# Patient Record
Sex: Male | Born: 1965 | Race: White | Hispanic: No | Marital: Married | Smoking: Never smoker
Health system: Southern US, Community
[De-identification: ages and names within clinical notes are randomized; demographics above are authoritative.]

## PROBLEM LIST (undated history)

## (undated) DIAGNOSIS — F32A Depression, unspecified: Secondary | ICD-10-CM

## (undated) DIAGNOSIS — F329 Major depressive disorder, single episode, unspecified: Secondary | ICD-10-CM

## (undated) DIAGNOSIS — R112 Nausea with vomiting, unspecified: Secondary | ICD-10-CM

## (undated) DIAGNOSIS — Z9889 Other specified postprocedural states: Secondary | ICD-10-CM

## (undated) DIAGNOSIS — F988 Other specified behavioral and emotional disorders with onset usually occurring in childhood and adolescence: Secondary | ICD-10-CM

## (undated) HISTORY — DX: Major depressive disorder, single episode, unspecified: F32.9

## (undated) HISTORY — PX: RHINOPLASTY: SUR1284

## (undated) HISTORY — DX: Other specified behavioral and emotional disorders with onset usually occurring in childhood and adolescence: F98.8

## (undated) HISTORY — DX: Depression, unspecified: F32.A

---

## 2009-09-04 ENCOUNTER — Encounter: Admission: RE | Admit: 2009-09-04 | Discharge: 2009-09-04 | Payer: Self-pay | Admitting: Sports Medicine

## 2009-09-19 ENCOUNTER — Encounter: Admission: RE | Admit: 2009-09-19 | Discharge: 2009-09-19 | Payer: Self-pay | Admitting: Sports Medicine

## 2010-07-15 ENCOUNTER — Encounter: Payer: Self-pay | Admitting: Sports Medicine

## 2012-05-11 ENCOUNTER — Other Ambulatory Visit: Payer: Self-pay | Admitting: Family Medicine

## 2012-05-11 ENCOUNTER — Ambulatory Visit
Admission: RE | Admit: 2012-05-11 | Discharge: 2012-05-11 | Disposition: A | Discharge status: Home / Self Care | Payer: BC Managed Care – PPO | Source: Ambulatory Visit | Attending: Family Medicine | Admitting: Family Medicine

## 2012-05-11 DIAGNOSIS — T148XXA Other injury of unspecified body region, initial encounter: Secondary | ICD-10-CM

## 2012-05-11 DIAGNOSIS — R52 Pain, unspecified: Secondary | ICD-10-CM

## 2014-12-23 ENCOUNTER — Encounter (HOSPITAL_BASED_OUTPATIENT_CLINIC_OR_DEPARTMENT_OTHER)
Admission: RE | Admit: 2014-12-23 | Discharge: 2014-12-23 | Disposition: A | Discharge status: Home / Self Care | Payer: BLUE CROSS/BLUE SHIELD | Source: Ambulatory Visit | Attending: Orthopedic Surgery | Admitting: Orthopedic Surgery

## 2014-12-23 ENCOUNTER — Encounter (HOSPITAL_BASED_OUTPATIENT_CLINIC_OR_DEPARTMENT_OTHER): Payer: Self-pay | Admitting: *Deleted

## 2014-12-23 DIAGNOSIS — M1712 Unilateral primary osteoarthritis, left knee: Secondary | ICD-10-CM | POA: Diagnosis not present

## 2014-12-23 DIAGNOSIS — M1711 Unilateral primary osteoarthritis, right knee: Secondary | ICD-10-CM | POA: Diagnosis present

## 2014-12-23 LAB — SURGICAL PCR SCREEN
MRSA, PCR: NEGATIVE
Staphylococcus aureus: NEGATIVE

## 2014-12-23 NOTE — Progress Notes (Signed)
Pt in and PAT Nasal MRSA swab sent.  No further PAT needed.  Will notify pt if swab positive and instructions given.

## 2014-12-28 ENCOUNTER — Other Ambulatory Visit: Payer: Self-pay | Admitting: Physician Assistant

## 2015-01-03 ENCOUNTER — Other Ambulatory Visit: Payer: Self-pay | Admitting: Physician Assistant

## 2015-01-03 NOTE — H&P (Signed)
This is a 49 year-old gentleman who presents to our clinic today with continued right knee pain.  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  This has been going on for over a year now.  He has tried Corticosteroid and Visco supplementation injections, all with decreasing effectiveness.  At this point in time he is ready to proceed with a medial compartment knee replacement on the right.   Past medical, social and family history reviewed in detail on the patient questionnaire and signed.  Review of systems: As detailed in HPI.  All others reviewed and are negative.   EXAMINATION: Well-developed, well-nourished male in no acute distress.  Alert and oriented x 3.  Examination of his right knee reveals range of motion 0-125 degrees.  Minimal patellofemoral crepitus.  No joint line tenderness.  Minimal medial joint line tenderness.  Negative log roll.  Negative straight leg raise.    X-RAYS: Four views of the right knee with sizing balls, to include a stress view, reveal moderate degenerative changes in the medial compartment.  Minimal changes on the left in the lateral and patellofemoral compartments.  Really no closing of the lateral compartment on stress view.    IMPRESSION: End stage degenerative joint disease of the right knee medial compartment.    PLAN: At this point in time there is nothing short of a medial compartment arthroplasty of the right knee that is going to relieve Ruben Mcdonald's pain.  We are proceeding with this in the near future.  Risks, benefits and possible complications of surgery have been reviewed.  Rehab and recovery time discussed.  All questions answered.  Paperwork complete. We will see Ruben Mcdonald at the time of operative intervention.     Loreta Aveaniel F. Murphy, M.D

## 2015-01-05 ENCOUNTER — Ambulatory Visit (HOSPITAL_BASED_OUTPATIENT_CLINIC_OR_DEPARTMENT_OTHER): Payer: BLUE CROSS/BLUE SHIELD | Admitting: Anesthesiology

## 2015-01-05 ENCOUNTER — Encounter (HOSPITAL_BASED_OUTPATIENT_CLINIC_OR_DEPARTMENT_OTHER): Payer: Self-pay | Admitting: *Deleted

## 2015-01-05 ENCOUNTER — Ambulatory Visit (HOSPITAL_BASED_OUTPATIENT_CLINIC_OR_DEPARTMENT_OTHER)
Admission: RE | Admit: 2015-01-05 | Discharge: 2015-01-06 | Disposition: A | Discharge status: Home / Self Care | Payer: BLUE CROSS/BLUE SHIELD | Source: Ambulatory Visit | Attending: Orthopedic Surgery | Admitting: Orthopedic Surgery

## 2015-01-05 ENCOUNTER — Encounter (HOSPITAL_BASED_OUTPATIENT_CLINIC_OR_DEPARTMENT_OTHER)
Admission: RE | Disposition: A | Discharge status: Home / Self Care | Payer: Self-pay | Source: Ambulatory Visit | Attending: Orthopedic Surgery

## 2015-01-05 DIAGNOSIS — M1712 Unilateral primary osteoarthritis, left knee: Secondary | ICD-10-CM | POA: Diagnosis not present

## 2015-01-05 DIAGNOSIS — Z96651 Presence of right artificial knee joint: Secondary | ICD-10-CM

## 2015-01-05 HISTORY — PX: PARTIAL KNEE ARTHROPLASTY: SHX2174

## 2015-01-05 HISTORY — DX: Nausea with vomiting, unspecified: R11.2

## 2015-01-05 HISTORY — DX: Other specified postprocedural states: Z98.890

## 2015-01-05 LAB — POCT HEMOGLOBIN-HEMACUE: Hemoglobin: 15.1 g/dL (ref 13.0–17.0)

## 2015-01-05 SURGERY — ARTHROPLASTY, KNEE, UNICOMPARTMENTAL
Anesthesia: General | Site: Knee | Laterality: Right

## 2015-01-05 MED ORDER — DOCUSATE SODIUM 100 MG PO CAPS
100.0000 mg | ORAL_CAPSULE | Freq: Two times a day (BID) | ORAL | Status: DC
  Administered 2015-01-05: 100 mg via ORAL
  Filled 2015-01-05: qty 1

## 2015-01-05 MED ORDER — HYDROMORPHONE HCL 1 MG/ML IJ SOLN
INTRAMUSCULAR | Status: AC
  Filled 2015-01-05: qty 1

## 2015-01-05 MED ORDER — METHOCARBAMOL 500 MG PO TABS
500.0000 mg | ORAL_TABLET | Freq: Four times a day (QID) | ORAL | Status: DC | PRN
Start: 2015-01-05 — End: 2015-01-06
  Administered 2015-01-05 – 2015-01-06 (×2): 500 mg via ORAL
  Filled 2015-01-05 (×2): qty 1

## 2015-01-05 MED ORDER — DEXAMETHASONE SODIUM PHOSPHATE 4 MG/ML IJ SOLN
INTRAMUSCULAR | Status: DC | PRN
  Administered 2015-01-05: 10 mg via INTRAVENOUS

## 2015-01-05 MED ORDER — MIDAZOLAM HCL 2 MG/2ML IJ SOLN
1.0000 mg | INTRAMUSCULAR | Status: DC | PRN
  Administered 2015-01-05: 2 mg via INTRAVENOUS

## 2015-01-05 MED ORDER — POLYETHYLENE GLYCOL 3350 17 G PO PACK: 17.0000 g | PACK | Freq: Every day | ORAL | Status: DC | PRN

## 2015-01-05 MED ORDER — SCOPOLAMINE 1 MG/3DAYS TD PT72: 1.0000 | MEDICATED_PATCH | Freq: Once | TRANSDERMAL | Status: DC | PRN

## 2015-01-05 MED ORDER — OXYCODONE-ACETAMINOPHEN 5-325 MG PO TABS: 1.0000 | ORAL_TABLET | ORAL | Status: DC | PRN

## 2015-01-05 MED ORDER — BISACODYL 5 MG PO TBEC: 5.0000 mg | DELAYED_RELEASE_TABLET | Freq: Every day | ORAL | Status: DC | PRN

## 2015-01-05 MED ORDER — PROPOFOL 10 MG/ML IV BOLUS
INTRAVENOUS | Status: DC | PRN
  Administered 2015-01-05: 200 mg via INTRAVENOUS

## 2015-01-05 MED ORDER — METOCLOPRAMIDE HCL 5 MG PO TABS
5.0000 mg | ORAL_TABLET | Freq: Three times a day (TID) | ORAL | Status: DC | PRN
Start: 2015-01-05 — End: 2015-01-06

## 2015-01-05 MED ORDER — METOCLOPRAMIDE HCL 5 MG/ML IJ SOLN: 5.0000 mg | Freq: Three times a day (TID) | INTRAMUSCULAR | Status: DC | PRN

## 2015-01-05 MED ORDER — ASPIRIN EC 325 MG PO TBEC: 325.0000 mg | DELAYED_RELEASE_TABLET | Freq: Every day | ORAL | Status: DC

## 2015-01-05 MED ORDER — ONDANSETRON HCL 4 MG PO TABS: 4.0000 mg | ORAL_TABLET | Freq: Four times a day (QID) | ORAL | Status: DC | PRN

## 2015-01-05 MED ORDER — ONDANSETRON HCL 4 MG/2ML IJ SOLN: 4.0000 mg | Freq: Four times a day (QID) | INTRAMUSCULAR | Status: DC | PRN

## 2015-01-05 MED ORDER — SODIUM CHLORIDE 0.9 % IR SOLN
Status: DC | PRN
  Administered 2015-01-05: 3000 mL

## 2015-01-05 MED ORDER — LACTATED RINGERS IV SOLN
INTRAVENOUS | Status: DC
  Administered 2015-01-05: 10 mL/h via INTRAVENOUS
  Administered 2015-01-05: 07:00:00 via INTRAVENOUS

## 2015-01-05 MED ORDER — METHOCARBAMOL 500 MG PO TABS: 500.0000 mg | ORAL_TABLET | Freq: Four times a day (QID) | ORAL | Status: DC

## 2015-01-05 MED ORDER — BUPIVACAINE LIPOSOME 1.3 % IJ SUSP
INTRAMUSCULAR | Status: DC | PRN
  Administered 2015-01-05: 20 mL

## 2015-01-05 MED ORDER — GLYCOPYRROLATE 0.2 MG/ML IJ SOLN: 0.2000 mg | Freq: Once | INTRAMUSCULAR | Status: DC | PRN

## 2015-01-05 MED ORDER — CEFAZOLIN SODIUM-DEXTROSE 2-3 GM-% IV SOLR
INTRAVENOUS | Status: AC
  Filled 2015-01-05: qty 50

## 2015-01-05 MED ORDER — FENTANYL CITRATE (PF) 100 MCG/2ML IJ SOLN
INTRAMUSCULAR | Status: AC
  Filled 2015-01-05: qty 2

## 2015-01-05 MED ORDER — MORPHINE SULFATE 10 MG/ML IJ SOLN
INTRAMUSCULAR | Status: DC | PRN
  Administered 2015-01-05 (×3): 2 mg via INTRAVENOUS

## 2015-01-05 MED ORDER — VENLAFAXINE HCL ER 150 MG PO CP24
150.0000 mg | ORAL_CAPSULE | Freq: Every day | ORAL | Status: DC
Start: 2015-01-06 — End: 2015-01-06

## 2015-01-05 MED ORDER — ONDANSETRON HCL 4 MG/2ML IJ SOLN
4.0000 mg | Freq: Once | INTRAMUSCULAR | Status: AC | PRN
  Administered 2015-01-05: 4 mg via INTRAVENOUS
  Filled 2015-01-05: qty 2

## 2015-01-05 MED ORDER — BUPIVACAINE-EPINEPHRINE (PF) 0.5% -1:200000 IJ SOLN
INTRAMUSCULAR | Status: DC | PRN
  Administered 2015-01-05: 30 mL via PERINEURAL

## 2015-01-05 MED ORDER — LACTATED RINGERS IV SOLN: INTRAVENOUS | Status: DC

## 2015-01-05 MED ORDER — SODIUM CHLORIDE 0.9 % IR SOLN
Status: DC | PRN
  Administered 2015-01-05: 40 mL

## 2015-01-05 MED ORDER — HYDROMORPHONE HCL 1 MG/ML IJ SOLN
0.2500 mg | INTRAMUSCULAR | Status: DC | PRN
  Administered 2015-01-05 (×2): 0.5 mg via INTRAVENOUS

## 2015-01-05 MED ORDER — ONDANSETRON HCL 4 MG PO TABS: 4.0000 mg | ORAL_TABLET | Freq: Three times a day (TID) | ORAL | Status: DC | PRN

## 2015-01-05 MED ORDER — ONDANSETRON HCL 4 MG/2ML IJ SOLN
INTRAMUSCULAR | Status: DC | PRN
  Administered 2015-01-05: 4 mg via INTRAVENOUS

## 2015-01-05 MED ORDER — LIDOCAINE HCL (CARDIAC) 20 MG/ML IV SOLN
INTRAVENOUS | Status: DC | PRN
  Administered 2015-01-05: 50 mg via INTRAVENOUS

## 2015-01-05 MED ORDER — HYDROMORPHONE HCL 1 MG/ML IJ SOLN: 0.5000 mg | INTRAMUSCULAR | Status: DC | PRN

## 2015-01-05 MED ORDER — MORPHINE SULFATE 10 MG/ML IJ SOLN
INTRAMUSCULAR | Status: AC
  Filled 2015-01-05: qty 1

## 2015-01-05 MED ORDER — BUPIVACAINE LIPOSOME 1.3 % IJ SUSP
INTRAMUSCULAR | Status: AC
  Filled 2015-01-05: qty 20

## 2015-01-05 MED ORDER — POTASSIUM CHLORIDE IN NACL 20-0.9 MEQ/L-% IV SOLN
INTRAVENOUS | Status: DC
  Administered 2015-01-05: 13:00:00 via INTRAVENOUS
  Filled 2015-01-05: qty 1000

## 2015-01-05 MED ORDER — CEFAZOLIN SODIUM-DEXTROSE 2-3 GM-% IV SOLR
2.0000 g | INTRAVENOUS | Status: AC
  Administered 2015-01-05: 2 g via INTRAVENOUS

## 2015-01-05 MED ORDER — OXYCODONE-ACETAMINOPHEN 5-325 MG PO TABS
1.0000 | ORAL_TABLET | ORAL | Status: DC | PRN
  Administered 2015-01-05: 1 via ORAL
  Administered 2015-01-06 (×3): 2 via ORAL
  Filled 2015-01-05 (×3): qty 2
  Filled 2015-01-05: qty 1

## 2015-01-05 MED ORDER — MEPERIDINE HCL 25 MG/ML IJ SOLN: 6.2500 mg | INTRAMUSCULAR | Status: DC | PRN

## 2015-01-05 MED ORDER — FENTANYL CITRATE (PF) 100 MCG/2ML IJ SOLN
50.0000 ug | INTRAMUSCULAR | Status: DC | PRN
  Administered 2015-01-05: 100 ug via INTRAVENOUS

## 2015-01-05 MED ORDER — MAGNESIUM CITRATE PO SOLN: 1.0000 | Freq: Once | ORAL | Status: AC | PRN

## 2015-01-05 MED ORDER — METHOCARBAMOL 1000 MG/10ML IJ SOLN: 500.0000 mg | Freq: Four times a day (QID) | INTRAVENOUS | Status: DC | PRN

## 2015-01-05 MED ORDER — CHLORHEXIDINE GLUCONATE 4 % EX LIQD: 60.0000 mL | Freq: Once | CUTANEOUS | Status: DC

## 2015-01-05 MED ORDER — MIDAZOLAM HCL 2 MG/2ML IJ SOLN
INTRAMUSCULAR | Status: AC
  Filled 2015-01-05: qty 2

## 2015-01-05 SURGICAL SUPPLY — 75 items
BAG DECANTER FOR FLEXI CONT (MISCELLANEOUS) ×2 IMPLANT
BANDAGE ELASTIC 4 VELCRO ST LF (GAUZE/BANDAGES/DRESSINGS) ×2 IMPLANT
BANDAGE ELASTIC 6 VELCRO ST LF (GAUZE/BANDAGES/DRESSINGS) ×2 IMPLANT
BANDAGE ESMARK 6X9 LF (GAUZE/BANDAGES/DRESSINGS) ×1 IMPLANT
BENZOIN TINCTURE PRP APPL 2/3 (GAUZE/BANDAGES/DRESSINGS) ×2 IMPLANT
BLADE SAW RECIP 87.9 MT (BLADE) ×2 IMPLANT
BLADE SAW SGTL 13.0X1.19X90.0M (BLADE) ×2 IMPLANT
BLADE SURG 10 STRL SS (BLADE) ×2 IMPLANT
BLADE SURG 15 STRL LF DISP TIS (BLADE) ×2 IMPLANT
BLADE SURG 15 STRL SS (BLADE) ×2
BNDG ESMARK 6X9 LF (GAUZE/BANDAGES/DRESSINGS) ×2
BOWL SMART MIX CTS (DISPOSABLE) ×2 IMPLANT
CAPT KNEE PARTIAL 2 ×2 IMPLANT
CEMENT BONE SIMPLEX SPEEDSET (Cement) ×2 IMPLANT
COVER BACK TABLE 60X90IN (DRAPES) IMPLANT
CUFF TOURNIQUET SINGLE 34IN LL (TOURNIQUET CUFF) ×2 IMPLANT
DECANTER SPIKE VIAL GLASS SM (MISCELLANEOUS) IMPLANT
DRAPE EXTREMITY T 121X128X90 (DRAPE) ×2 IMPLANT
DRAPE INCISE IOBAN 66X45 STRL (DRAPES) ×2 IMPLANT
DRAPE U 20/CS (DRAPES) ×2 IMPLANT
DRAPE U-SHAPE 47X51 STRL (DRAPES) ×2 IMPLANT
DURAPREP 26ML APPLICATOR (WOUND CARE) ×2 IMPLANT
ELECT REM PT RETURN 9FT ADLT (ELECTROSURGICAL) ×2
ELECTRODE REM PT RTRN 9FT ADLT (ELECTROSURGICAL) ×1 IMPLANT
FACESHIELD WRAPAROUND (MASK) ×2 IMPLANT
GAUZE SPONGE 4X4 12PLY STRL (GAUZE/BANDAGES/DRESSINGS) ×2 IMPLANT
GAUZE XEROFORM 1X8 LF (GAUZE/BANDAGES/DRESSINGS) ×2 IMPLANT
GLOVE BIOGEL PI IND STRL 7.0 (GLOVE) ×3 IMPLANT
GLOVE BIOGEL PI INDICATOR 7.0 (GLOVE) ×3
GLOVE ECLIPSE 7.0 STRL STRAW (GLOVE) ×2 IMPLANT
GLOVE EXAM NITRILE MD LF STRL (GLOVE) ×4 IMPLANT
GLOVE ORTHO TXT STRL SZ7.5 (GLOVE) IMPLANT
GLOVE SURG ORTHO 8.0 STRL STRW (GLOVE) ×2 IMPLANT
GLOVE SURG SS PI 7.0 STRL IVOR (GLOVE) ×4 IMPLANT
GOWN STRL REUS W/ TWL LRG LVL3 (GOWN DISPOSABLE) ×2 IMPLANT
GOWN STRL REUS W/ TWL XL LVL3 (GOWN DISPOSABLE) ×2 IMPLANT
GOWN STRL REUS W/TWL LRG LVL3 (GOWN DISPOSABLE) ×2
GOWN STRL REUS W/TWL XL LVL3 (GOWN DISPOSABLE) ×2
HANDPIECE INTERPULSE COAX TIP (DISPOSABLE) ×1
IMMOBILIZER KNEE 22 UNIV (SOFTGOODS) IMPLANT
IMMOBILIZER KNEE 24 THIGH 36 (MISCELLANEOUS) ×2 IMPLANT
IMMOBILIZER KNEE 24 UNIV (MISCELLANEOUS) ×4
KNEE CAPITATED PARTIAL 2 ×1 IMPLANT
KNEE WRAP E Z 3 GEL PACK (MISCELLANEOUS) ×2 IMPLANT
MANIFOLD NEPTUNE II (INSTRUMENTS) ×2 IMPLANT
NDL SAFETY ECLIPSE 18X1.5 (NEEDLE) ×1 IMPLANT
NEEDLE HYPO 18GX1.5 SHARP (NEEDLE) ×1
NS IRRIG 1000ML POUR BTL (IV SOLUTION) ×2 IMPLANT
PACK ARTHROSCOPY DSU (CUSTOM PROCEDURE TRAY) ×2 IMPLANT
PACK BASIN DAY SURGERY FS (CUSTOM PROCEDURE TRAY) ×2 IMPLANT
PADDING CAST COTTON 6X4 STRL (CAST SUPPLIES) ×2 IMPLANT
PENCIL BUTTON HOLSTER BLD 10FT (ELECTRODE) ×2 IMPLANT
SET HNDPC FAN SPRY TIP SCT (DISPOSABLE) ×1 IMPLANT
SHEET MEDIUM DRAPE 40X70 STRL (DRAPES) IMPLANT
SLEEVE SCD COMPRESS KNEE MED (MISCELLANEOUS) ×2 IMPLANT
SPONGE LAP 18X18 X RAY DECT (DISPOSABLE) ×4 IMPLANT
STAPLER VISISTAT 35W (STAPLE) IMPLANT
STRIP CLOSURE SKIN 1/2X4 (GAUZE/BANDAGES/DRESSINGS) ×2 IMPLANT
SUCTION FRAZIER TIP 10 FR DISP (SUCTIONS) IMPLANT
SUT FIBERWIRE #2 38 T-5 BLUE (SUTURE)
SUT MNCRL AB 4-0 PS2 18 (SUTURE) IMPLANT
SUT VIC AB 0 CT1 27 (SUTURE)
SUT VIC AB 0 CT1 27XBRD ANBCTR (SUTURE) IMPLANT
SUT VIC AB 0 SH 27 (SUTURE) IMPLANT
SUT VIC AB 1 CT1 27 (SUTURE) ×1
SUT VIC AB 1 CT1 27XBRD ANBCTR (SUTURE) ×1 IMPLANT
SUT VIC AB 2-0 SH 27 (SUTURE) ×2
SUT VIC AB 2-0 SH 27XBRD (SUTURE) ×2 IMPLANT
SUTURE FIBERWR #2 38 T-5 BLUE (SUTURE) IMPLANT
SYR 50ML LL SCALE MARK (SYRINGE) ×2 IMPLANT
SYR BULB 3OZ (MISCELLANEOUS) ×2 IMPLANT
TOWEL OR 17X24 6PK STRL BLUE (TOWEL DISPOSABLE) ×2 IMPLANT
TOWEL OR NON WOVEN STRL DISP B (DISPOSABLE) IMPLANT
TUBE CONNECTING 20X1/4 (TUBING) IMPLANT
YANKAUER SUCT BULB TIP NO VENT (SUCTIONS) ×2 IMPLANT

## 2015-01-05 NOTE — H&P (View-Only) (Signed)
This is a 48 year-old gentleman who presents to our clinic today with continued right knee pain.  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  This has been going on for over a year now.  He has tried Corticosteroid and Visco supplementation injections, all with decreasing effectiveness.  At this point in time he is ready to proceed with a medial compartment knee replacement on the right.   Past medical, social and family history reviewed in detail on the patient questionnaire and signed.  Review of systems: As detailed in HPI.  All others reviewed and are negative.   EXAMINATION: Well-developed, well-nourished male in no acute distress.  Alert and oriented x 3.  Examination of his right knee reveals range of motion 0-125 degrees.  Minimal patellofemoral crepitus.  No joint line tenderness.  Minimal medial joint line tenderness.  Negative log roll.  Negative straight leg raise.    X-RAYS: Four views of the right knee with sizing balls, to include a stress view, reveal moderate degenerative changes in the medial compartment.  Minimal changes on the left in the lateral and patellofemoral compartments.  Really no closing of the lateral compartment on stress view.    IMPRESSION: End stage degenerative joint disease of the right knee medial compartment.    PLAN: At this point in time there is nothing short of a medial compartment arthroplasty of the right knee that is going to relieve Ruben Mcdonald's pain.  We are proceeding with this in the near future.  Risks, benefits and possible complications of surgery have been reviewed.  Rehab and recovery time discussed.  All questions answered.  Paperwork complete. We will see Ruben Mcdonald at the time of operative intervention.     Daniel F. Murphy, M.D  

## 2015-01-05 NOTE — Discharge Instructions (Signed)
INSTRUCTIONS AFTER JOINT REPLACEMENT   o Remove items at home which could result in a fall. This includes throw rugs or furniture in walking pathways o ICE to the affected joint every three hours while awake for 30 minutes at a time, for at least the first 3-5 days, and then as needed for pain and swelling.  Continue to use ice for pain and swelling. You may notice swelling that will progress down to the foot and ankle.  This is normal after surgery.  Elevate your leg when you are not up walking on it.   o Continue to use the breathing machine you got in the hospital (incentive spirometer) which will help keep your temperature down.  It is common for your temperature to cycle up and down following surgery, especially at night when you are not up moving around and exerting yourself.  The breathing machine keeps your lungs expanded and your temperature down.  BLOOD THINNER: TAKE ASPIRIN 325 MG ONE TABLET ONCE DAILY X 30 DAYS FOLLOWING SURGERY TO PREVENT BLOOD CLOTS.  DIET:  As you were doing prior to hospitalization, we recommend a well-balanced diet.  DRESSING / WOUND CARE / SHOWERING  You may change your dressing 3-5 days after surgery.  Then change the dressing every day with sterile gauze.  Please use good hand washing techniques before changing the dressing.  Do not use any lotions or creams on the incision until instructed by your surgeon. and You may shower 3 days after surgery, but keep the wounds dry during showering.  You may use an occlusive plastic wrap (Press'n Seal for example), NO SOAKING/SUBMERGING IN THE BATHTUB.  If the bandage gets wet, change with a clean dry gauze.  If the incision gets wet, pat the wound dry with a clean towel.  ACTIVITY  o Increase activity slowly as tolerated, but follow the weight bearing instructions below.   o No driving for 6 weeks or until further direction given by your physician.  You cannot drive while taking narcotics.  o No lifting or carrying  greater than 10 lbs. until further directed by your surgeon. o Avoid periods of inactivity such as sitting longer than an hour when not asleep. This helps prevent blood clots.  o You may return to work once you are authorized by your doctor.     WEIGHT BEARING   Weight bearing as tolerated with assist device (walker, cane, etc) as directed, use it as long as suggested by your surgeon or therapist, typically at least 4-6 weeks.   EXERCISES  Results after joint replacement surgery are often greatly improved when you follow the exercise, range of motion and muscle strengthening exercises prescribed by your doctor. Safety measures are also important to protect the joint from further injury. Any time any of these exercises cause you to have increased pain or swelling, decrease what you are doing until you are comfortable again and then slowly increase them. If you have problems or questions, call your caregiver or physical therapist for advice.   Rehabilitation is important following a joint replacement. After just a few days of immobilization, the muscles of the leg can become weakened and shrink (atrophy).  These exercises are designed to build up the tone and strength of the thigh and leg muscles and to improve motion. Often times heat used for twenty to thirty minutes before working out will loosen up your tissues and help with improving the range of motion but do not use heat for the first two  weeks following surgery (sometimes heat can increase post-operative swelling).   These exercises can be done on a training (exercise) mat, on the floor, on a table or on a bed. Use whatever works the best and is most comfortable for you.    Use music or television while you are exercising so that the exercises are a pleasant break in your day. This will make your life better with the exercises acting as a break in your routine that you can look forward to.   Perform all exercises about fifteen times, three  times per day or as directed.  You should exercise both the operative leg and the other leg as well.  Exercises include:    Quad Sets - Tighten up the muscle on the front of the thigh (Quad) and hold for 5-10 seconds.    Straight Leg Raises - With your knee straight (if you were given a brace, keep it on), lift the leg to 60 degrees, hold for 3 seconds, and slowly lower the leg.  Perform this exercise against resistance later as your leg gets stronger.   Leg Slides: Lying on your back, slowly slide your foot toward your buttocks, bending your knee up off the floor (only go as far as is comfortable). Then slowly slide your foot back down until your leg is flat on the floor again.   Angel Wings: Lying on your back spread your legs to the side as far apart as you can without causing discomfort.   Hamstring Strength:  Lying on your back, push your heel against the floor with your leg straight by tightening up the muscles of your buttocks.  Repeat, but this time bend your knee to a comfortable angle, and push your heel against the floor.  You may put a pillow under the heel to make it more comfortable if necessary.   A rehabilitation program following joint replacement surgery can speed recovery and prevent re-injury in the future due to weakened muscles. Contact your doctor or a physical therapist for more information on knee rehabilitation.    CONSTIPATION  Constipation is defined medically as fewer than three stools per week and severe constipation as less than one stool per week.  Even if you have a regular bowel pattern at home, your normal regimen is likely to be disrupted due to multiple reasons following surgery.  Combination of anesthesia, postoperative narcotics, change in appetite and fluid intake all can affect your bowels.   YOU MUST use at least one of the following options; they are listed in order of increasing strength to get the job done.  They are all available over the counter,  and you may need to use some, POSSIBLY even all of these options:    Drink plenty of fluids (prune juice may be helpful) and high fiber foods Colace 100 mg by mouth twice a day  Senokot for constipation as directed and as needed Dulcolax (bisacodyl), take with full glass of water  Miralax (polyethylene glycol) once or twice a day as needed.  If you have tried all these things and are unable to have a bowel movement in the first 3-4 days after surgery call either your surgeon or your primary doctor.    If you experience loose stools or diarrhea, hold the medications until you stool forms back up.  If your symptoms do not get better within 1 week or if they get worse, check with your doctor.  If you experience "the worst abdominal pain ever" or  develop nausea or vomiting, please contact the office immediately for further recommendations for treatment.   ITCHING:  If you experience itching with your medications, try taking only a single pain pill, or even half a pain pill at a time.  You can also use Benadryl over the counter for itching or also to help with sleep.   TED HOSE STOCKINGS:  Use stockings on both legs until for at least 2 weeks or as directed by physician office. They may be removed at night for sleeping.  MEDICATIONS:  See your medication summary on the After Visit Summary that nursing will review with you.  You may have some home medications which will be placed on hold until you complete the course of blood thinner medication.  It is important for you to complete the blood thinner medication as prescribed.  PRECAUTIONS:  If you experience chest pain or shortness of breath - call 911 immediately for transfer to the hospital emergency department.   If you develop a fever greater that 101 F, purulent drainage from wound, increased redness or drainage from wound, foul odor from the wound/dressing, or calf pain - CONTACT YOUR SURGEON.                                                    FOLLOW-UP APPOINTMENTS:  If you do not already have a post-op appointment, please call the office for an appointment to be seen by your surgeon.  Guidelines for how soon to be seen are listed in your After Visit Summary, but are typically between 1-4 weeks after surgery.  OTHER INSTRUCTIONS:   Knee Replacement:  Do not place pillow under knee, focus on keeping the knee straight while resting. CPM instructions: 0-90 degrees, 2 hours in the morning, 2 hours in the afternoon, and 2 hours in the evening. Place foam block, curve side up under heel at all times except when in CPM or when walking.  DO NOT modify, tear, cut, or change the foam block in any way.  MAKE SURE YOU:   Understand these instructions.   Get help right away if you are not doing well or get worse.    Thank you for letting us be a part of your medical care team.  It is a privilege we respect greatly.  We hope these instructions will help you stay on track for a fast and full recovery!    Post Anesthesia Home Care Instructions  Activity: Get plenty of rest for the remainder of the day. A responsible adult should stay with you for 24 hours following the procedure.  For the next 24 hours, DO NOT: -Drive a car -Advertising copywriter -Drink alcoholic beverages -Take any medication unless instructed by your physician -Make any legal decisions or sign important papers.  Meals: Start with liquid foods such as gelatin or soup. Progress to regular foods as tolerated. Avoid greasy, spicy, heavy foods. If nausea and/or vomiting occur, drink only clear liquids until the nausea and/or vomiting subsides. Call your physician if vomiting continues.  Special Instructions/Symptoms: Your throat may feel dry or sore from the anesthesia or the breathing tube placed in your throat during surgery. If this causes discomfort, gargle with warm salt water. The discomfort should disappear within 24 hours.  If you had a scopolamine patch placed  behind your ear for the management of  post- operative nausea and/or vomiting:  1. The medication in the patch is effective for 72 hours, after which it should be removed.  Wrap patch in a tissue and discard in the trash. Wash hands thoroughly with soap and water. 2. You may remove the patch earlier than 72 hours if you experience unpleasant side effects which may include dry mouth, dizziness or visual disturbances. 3. Avoid touching the patch. Wash your hands with soap and water after contact with the patch.   Regional Anesthesia Blocks  1. Numbness or the inability to move the "blocked" extremity may last from 3-48 hours after placement. The length of time depends on the medication injected and your individual response to the medication. If the numbness is not going away after 48 hours, call your surgeon.  2. The extremity that is blocked will need to be protected until the numbness is gone and the  Strength has returned. Because you cannot feel it, you will need to take extra care to avoid injury. Because it may be weak, you may have difficulty moving it or using it. You may not know what position it is in without looking at it while the block is in effect.  3. For blocks in the legs and feet, returning to weight bearing and walking needs to be done carefully. You will need to wait until the numbness is entirely gone and the strength has returned. You should be able to move your leg and foot normally before you try and bear weight or walk. You will need someone to be with you when you first try to ensure you do not fall and possibly risk injury.  4. Bruising and tenderness at the needle site are common side effects and will resolve in a few days.  5. Persistent numbness or new problems with movement should be communicated to the surgeon or the [facility] [phone])/ [facility] [place] [place] [phone]).

## 2015-01-05 NOTE — Anesthesia Preprocedure Evaluation (Signed)
Anesthesia Evaluation  Patient identified by MRN, date of birth, ID band Patient awake    Reviewed: Allergy & Precautions, NPO status , Patient's Chart, lab work & pertinent test results  History of Anesthesia Complications (+) PONV  Airway Mallampati: I  TM Distance: >3 FB Neck ROM: Full    Dental   Pulmonary    Pulmonary exam normal        Cardiovascular Normal cardiovascular exam     Neuro/Psych    GI/Hepatic   Endo/Other    Renal/GU      Musculoskeletal   Abdominal   Peds  Hematology   Anesthesia Other Findings   Reproductive/Obstetrics                             Anesthesia Physical Anesthesia Plan  ASA: II  Anesthesia Plan: General   Post-op Pain Management:    Induction: Intravenous  Airway Management Planned: LMA  Additional Equipment:   Intra-op Plan:   Post-operative Plan: Extubation in OR  Informed Consent: I have reviewed the patients History and Physical, chart, labs and discussed the procedure including the risks, benefits and alternatives for the proposed anesthesia with the patient or authorized representative who has indicated his/her understanding and acceptance.     Plan Discussed with: CRNA and Surgeon  Anesthesia Plan Comments:         Anesthesia Quick Evaluation  

## 2015-01-05 NOTE — Interval H&P Note (Signed)
History and Physical Interval Note:  01/05/2015 7:34 AM  Randa SpikeSteven Haile  has presented today for surgery, with the diagnosis of unilateral primary osteoarthritis right knee   M17.11  The various methods of treatment have been discussed with the patient and family. After consideration of risks, benefits and other options for treatment, the patient has consented to  Procedure(s): RIGHT UNICOMPARTMENTAL KNEE (MEDIAL) (Right) as a surgical intervention .  The patient's history has been reviewed, patient examined, no change in status, stable for surgery.  I have reviewed the patient's chart and labs.  Questions were answered to the patient's satisfaction.     Vivia Rosenburg F

## 2015-01-05 NOTE — Anesthesia Postprocedure Evaluation (Signed)
Anesthesia Post Note  Patient: Ruben SpikeSteven Mcdonald  Procedure(s) Performed: Procedure(s) (LRB): RIGHT UNICOMPARTMENTAL KNEE (MEDIAL) (Right)  Anesthesia type: general  Patient location: PACU  Post pain: Pain level controlled  Post assessment: Patient's Cardiovascular Status Stable  Last Vitals:  Filed Vitals:   01/05/15 1230  BP: 143/90  Pulse: 88  Temp: 36.7 C  Resp: 16    Post vital signs: Reviewed and stable  Level of consciousness: sedated  Complications: No apparent anesthesia complications

## 2015-01-05 NOTE — Progress Notes (Signed)
AssistedDr. Ossey with right, ultrasound guided, adductor canal block. Side rails up, monitors on throughout procedure. See vital signs in flow sheet. Tolerated Procedure well.  

## 2015-01-05 NOTE — Transfer of Care (Signed)
Immediate Anesthesia Transfer of Care Note  Patient: Randa SpikeSteven Tippy  Procedure(s) Performed: Procedure(s): RIGHT UNICOMPARTMENTAL KNEE (MEDIAL) (Right)  Patient Location: PACU  Anesthesia Type:General  Level of Consciousness: awake and sedated  Airway & Oxygen Therapy: Patient Spontanous Breathing and Patient connected to face mask oxygen  Post-op Assessment: Report given to RN and Post -op Vital signs reviewed and stable  Post vital signs: Reviewed and stable  Last Vitals:  Filed Vitals:   01/05/15 0816  BP:   Pulse: 60  Temp:   Resp: 14    Complications: No apparent anesthesia complications

## 2015-01-05 NOTE — Anesthesia Procedure Notes (Addendum)
Anesthesia Regional Block:  Adductor canal block  Pre-Anesthetic Checklist: ,, timeout performed, Correct Patient, Correct Site, Correct Laterality, Correct Procedure, Correct Position, site marked, Risks and benefits discussed,  Surgical consent,  Pre-op evaluation,  At surgeon's request and post-op pain management  Laterality: Right  Prep: chloraprep       Needles:  Injection technique: Single-shot  Needle Type: Echogenic Stimulator Needle      Needle Gauge: 21 and 21 G    Additional Needles:  Procedures: ultrasound guided (picture in chart) Adductor canal block Narrative:  Start time: 01/05/2015 8:05 AM End time: 01/05/2015 8:15 AM Injection made incrementally with aspirations every 5 mL.  Performed by: Personally  Anesthesiologist: Arta BruceSSEY, KEVIN  Additional Notes: Monitors applied. Patient sedated. Sterile prep and drape,hand hygiene and sterile gloves were used. Relevant anatomy identified.Needle position confirmed.Local anesthetic injected incrementally after negative aspiration. Local anesthetic spread visualized around nerve(s). Vascular puncture avoided. No complications. Image printed for medical record.The patient tolerated the procedure well.    Arta BruceKevin Ossey MD   Procedure Name: LMA Insertion Performed by: York GricePEARSON, Sherrol Vicars W Pre-anesthesia Checklist: Patient identified, Timeout performed, Emergency Drugs available, Suction available and Patient being monitored Patient Re-evaluated:Patient Re-evaluated prior to inductionOxygen Delivery Method: Circle system utilized Preoxygenation: Pre-oxygenation with 100% oxygen Intubation Type: IV induction Ventilation: Mask ventilation without difficulty LMA: LMA inserted LMA Size: 5.0 Tube type: Oral Number of attempts: 1 Tube secured with: Tape Dental Injury: Teeth and Oropharynx as per pre-operative assessment

## 2015-01-06 ENCOUNTER — Encounter (HOSPITAL_BASED_OUTPATIENT_CLINIC_OR_DEPARTMENT_OTHER): Payer: Self-pay | Admitting: Orthopedic Surgery

## 2015-01-06 DIAGNOSIS — M1712 Unilateral primary osteoarthritis, left knee: Secondary | ICD-10-CM | POA: Diagnosis not present

## 2015-01-06 NOTE — Op Note (Signed)
NAMReola Mcdonald:  Dec, Ruben Mcdonald                 ACCOUNT NO.:  [id number]  MEDICAL RECORD NO.:  [id number]  LOCATION:                               FACILITY:  [facility]  PHYSICIAN:  Loreta Aveaniel F. Hobert Poplaski, M.D. DATE OF BIRTH:  Aug 18, 1965  DATE OF PROCEDURE:  01/05/2015 DATE OF DISCHARGE:                              OPERATIVE REPORT   PREOPERATIVE DIAGNOSIS:  Localized primary end-stage arthritis medial compartment, right knee.  POSTOPERATIVE DIAGNOSIS:  Localized primary end-stage arthritis medial compartment, right knee.  PROCEDURE:  Medial unicompartmental replacement, right knee utilizing Stryker PKR prosthesis.  Cemented pegged #5 femoral component.  Cemented #5 tibial component, 8-mm polyethylene insert.  SURGEON:  Loreta Aveaniel F. Sabin Gibeault, M.D.  ASSISTANT:  Mikey KirschnerLindsey Stanberry, PA present throughout the entire case and necessary for timely completion of procedure.  ANESTHESIA:  General.  BLOOD LOSS:  Minimal.  SPECIMENS:  None.  CULTURES:  None.  COMPLICATIONS:  None.  DRESSINGS:  Soft compressive knee immobilizer.  TOURNIQUET TIME:  1 hour and 15 minutes.  DESCRIPTION OF PROCEDURE:  The patient was brought to the operating room, placed on the operating table in supine position.  After adequate anesthesia had been obtained, tourniquet applied.  Prepped and draped in usual sterile fashion.  Exsanguinated with elevation of Esmarch. Tourniquet inflated to 350 mmHg.  Longitudinal incision medial parapatellar.  Skin and subcutaneous tissue divided.  Medial arthrotomy. Capsular release.  Menisci removed.  Erosive changes throughout the medial compartment.  Cruciate ligament, lateral meniscus, and other compartments looked good.  Utilizing a Stryker instrumentation, proximal tibial resection, protecting cruciate ligaments.  Size #5 component. Utilizing the tibial cut, I then made the distal and then posterior and chamfer cuts on the femur.  Trials put in place.  Drill holes made for definitive  components.  The femoral component settled, but medial on it, but that is where it fit the best and had the best knee function.  The wound was copiously irrigated.  Cement prepared, placed on all components, firmly seated.  Polyethylene attached to tibia, knee reduced.  Once cement hardened, the knee was examined.  Nicely balanced full motion and good stability.  Soft tissues injected with Exparel.  Arthrotomy closed with Ethibond with subcutaneous and subcuticular closure, margins were injected with Marcaine.  Sterile compressive dressing applied.  Tourniquet removed.  Knee immobilizer applied.  Anesthesia reversed.  Brought to the recovery room.  Tolerated the surgery well.  No complications.     Loreta Aveaniel F. Juanpablo Ciresi, M.D.   ______________________________ Loreta Aveaniel F. Arpita Fentress, M.D.    DFM/MEDQ  D:  01/05/2015  T:  01/06/2015  Job:  [id number]

## 2015-02-09 ENCOUNTER — Telehealth: Payer: Self-pay

## 2015-02-09 NOTE — Telephone Encounter (Signed)
LEFT MESSAGE FOR AMY CMA TO OBTAIN PRECERT FOR PTS UPCOMING ECHO [phone] [place]

## 2015-02-15 ENCOUNTER — Other Ambulatory Visit (HOSPITAL_COMMUNITY): Payer: BLUE CROSS/BLUE SHIELD

## 2015-02-21 ENCOUNTER — Other Ambulatory Visit: Payer: Self-pay

## 2015-02-21 ENCOUNTER — Ambulatory Visit (HOSPITAL_COMMUNITY): Payer: BLUE CROSS/BLUE SHIELD | Attending: Cardiology

## 2015-02-21 ENCOUNTER — Other Ambulatory Visit (HOSPITAL_COMMUNITY): Payer: Self-pay | Admitting: Family Medicine

## 2015-02-21 DIAGNOSIS — R079 Chest pain, unspecified: Secondary | ICD-10-CM

## 2015-02-21 DIAGNOSIS — I071 Rheumatic tricuspid insufficiency: Secondary | ICD-10-CM | POA: Insufficient documentation

## 2015-02-21 DIAGNOSIS — I77810 Thoracic aortic ectasia: Secondary | ICD-10-CM | POA: Diagnosis not present

## 2016-03-04 DIAGNOSIS — Z005 Encounter for examination of potential donor of organ and tissue: Secondary | ICD-10-CM | POA: Diagnosis not present

## 2016-03-26 DIAGNOSIS — L03116 Cellulitis of left lower limb: Secondary | ICD-10-CM | POA: Diagnosis not present

## 2016-05-15 DIAGNOSIS — L814 Other melanin hyperpigmentation: Secondary | ICD-10-CM | POA: Diagnosis not present

## 2016-05-15 DIAGNOSIS — Z86018 Personal history of other benign neoplasm: Secondary | ICD-10-CM | POA: Diagnosis not present

## 2016-05-15 DIAGNOSIS — L821 Other seborrheic keratosis: Secondary | ICD-10-CM | POA: Diagnosis not present

## 2016-05-15 DIAGNOSIS — D1800 Hemangioma unspecified site: Secondary | ICD-10-CM | POA: Diagnosis not present

## 2016-12-10 DIAGNOSIS — F329 Major depressive disorder, single episode, unspecified: Secondary | ICD-10-CM | POA: Diagnosis not present

## 2016-12-10 DIAGNOSIS — R1032 Left lower quadrant pain: Secondary | ICD-10-CM | POA: Diagnosis not present

## 2016-12-10 DIAGNOSIS — Z79899 Other long term (current) drug therapy: Secondary | ICD-10-CM | POA: Diagnosis not present

## 2016-12-10 DIAGNOSIS — F909 Attention-deficit hyperactivity disorder, unspecified type: Secondary | ICD-10-CM | POA: Diagnosis not present

## 2017-01-17 ENCOUNTER — Other Ambulatory Visit: Payer: Self-pay | Admitting: Physician Assistant

## 2017-01-17 DIAGNOSIS — Z01818 Encounter for other preprocedural examination: Secondary | ICD-10-CM | POA: Diagnosis not present

## 2017-01-17 DIAGNOSIS — R1032 Left lower quadrant pain: Secondary | ICD-10-CM | POA: Diagnosis not present

## 2017-01-17 DIAGNOSIS — Z1211 Encounter for screening for malignant neoplasm of colon: Secondary | ICD-10-CM | POA: Diagnosis not present

## 2017-01-17 DIAGNOSIS — Z8719 Personal history of other diseases of the digestive system: Secondary | ICD-10-CM

## 2017-01-24 ENCOUNTER — Ambulatory Visit
Admission: RE | Admit: 2017-01-24 | Discharge: 2017-01-24 | Disposition: A | Discharge status: Home / Self Care | Payer: BLUE CROSS/BLUE SHIELD | Source: Ambulatory Visit | Attending: Physician Assistant | Admitting: Physician Assistant

## 2017-01-24 DIAGNOSIS — N200 Calculus of kidney: Secondary | ICD-10-CM | POA: Diagnosis not present

## 2017-01-24 DIAGNOSIS — Z8719 Personal history of other diseases of the digestive system: Secondary | ICD-10-CM

## 2017-01-24 DIAGNOSIS — R1032 Left lower quadrant pain: Secondary | ICD-10-CM

## 2017-01-24 MED ORDER — IOPAMIDOL (ISOVUE-300) INJECTION 61%
100.0000 mL | Freq: Once | INTRAVENOUS | Status: AC | PRN
  Administered 2017-01-24: 100 mL via INTRAVENOUS

## 2017-03-10 DIAGNOSIS — F909 Attention-deficit hyperactivity disorder, unspecified type: Secondary | ICD-10-CM | POA: Diagnosis not present

## 2017-03-10 DIAGNOSIS — Z23 Encounter for immunization: Secondary | ICD-10-CM | POA: Diagnosis not present

## 2017-05-27 DIAGNOSIS — M545 Low back pain: Secondary | ICD-10-CM | POA: Diagnosis not present

## 2017-05-30 DIAGNOSIS — M545 Low back pain: Secondary | ICD-10-CM | POA: Diagnosis not present

## 2017-06-23 DIAGNOSIS — J209 Acute bronchitis, unspecified: Secondary | ICD-10-CM | POA: Diagnosis not present

## 2017-06-30 DIAGNOSIS — L821 Other seborrheic keratosis: Secondary | ICD-10-CM | POA: Diagnosis not present

## 2017-06-30 DIAGNOSIS — Z86018 Personal history of other benign neoplasm: Secondary | ICD-10-CM | POA: Diagnosis not present

## 2017-06-30 DIAGNOSIS — D1800 Hemangioma unspecified site: Secondary | ICD-10-CM | POA: Diagnosis not present

## 2017-06-30 DIAGNOSIS — L814 Other melanin hyperpigmentation: Secondary | ICD-10-CM | POA: Diagnosis not present

## 2017-07-14 DIAGNOSIS — R05 Cough: Secondary | ICD-10-CM | POA: Diagnosis not present

## 2017-08-27 DIAGNOSIS — F329 Major depressive disorder, single episode, unspecified: Secondary | ICD-10-CM | POA: Diagnosis not present

## 2017-08-27 DIAGNOSIS — Z0000 Encounter for general adult medical examination without abnormal findings: Secondary | ICD-10-CM | POA: Diagnosis not present

## 2017-08-27 DIAGNOSIS — Z13220 Encounter for screening for lipoid disorders: Secondary | ICD-10-CM | POA: Diagnosis not present

## 2017-08-27 DIAGNOSIS — F909 Attention-deficit hyperactivity disorder, unspecified type: Secondary | ICD-10-CM | POA: Diagnosis not present

## 2017-09-05 ENCOUNTER — Ambulatory Visit: Payer: BLUE CROSS/BLUE SHIELD | Admitting: Family Medicine

## 2017-09-05 ENCOUNTER — Encounter: Payer: Self-pay | Admitting: Family Medicine

## 2017-09-05 ENCOUNTER — Other Ambulatory Visit: Payer: Self-pay

## 2017-09-05 VITALS — BP 130/82 | HR 95 | Temp 98.0°F | Resp 16 | Ht 74.21 in | Wt 221.6 lb

## 2017-09-05 DIAGNOSIS — J0100 Acute maxillary sinusitis, unspecified: Secondary | ICD-10-CM

## 2017-09-05 DIAGNOSIS — R6889 Other general symptoms and signs: Secondary | ICD-10-CM

## 2017-09-05 LAB — POCT INFLUENZA A/B
Influenza A, POC: NEGATIVE
Influenza B, POC: NEGATIVE

## 2017-09-05 MED ORDER — PREDNISONE 20 MG PO TABS: ORAL_TABLET | ORAL | 0 refills | Status: DC

## 2017-09-05 MED ORDER — AMOXICILLIN-POT CLAVULANATE 875-125 MG PO TABS: 1.0000 | ORAL_TABLET | Freq: Two times a day (BID) | ORAL | 0 refills | Status: DC

## 2017-09-05 MED ORDER — PSEUDOEPHEDRINE HCL ER 120 MG PO TB12: 120.0000 mg | ORAL_TABLET | Freq: Two times a day (BID) | ORAL | 0 refills | Status: DC

## 2017-09-05 NOTE — Progress Notes (Signed)
Subjective:    Patient ID: Ruben Mcdonald, male    DOB: 1965-11-15, 52 y.o.   MRN: [id number]  09/05/2017  Cough (productive with some chest conestion since Sunday ) and Nasal Congestion (with drainage pt states the pressure in the face behind the eye  is the worst feels like sinus pain )    HPI This 52 y.o. male presents for evaluation of cough and nasal congestion for the past five days.  Onset with chest congestion.  Bronchitis in January.  Now having horrible sinus congestion.  No fever/chills/sweats/body aches.  Went to work this week.  Nasal congestion and rhinorrhea.  Headaches onset two nights ago with severe sinus pressure.  +coughing; no sputum production; upper airway.  No SOB.  No wheezing.  No n/v/d.   OTC congestion and cough.  Nettie pot twice daily.  History of nasal fracture.  Greenish yet not horrible.  S/p flu vaccine.    Dorothy Therapist, nutritional PA at [place] at [facility].   BP Readings from Last 3 Encounters:  09/05/17 130/82  01/06/15 119/62   Wt Readings from Last 3 Encounters:  09/05/17 221 lb 9.6 oz (100.5 kg)  01/05/15 224 lb 4 oz (101.7 kg)    There is no immunization history on file for this patient.  Review of Systems  Constitutional: Negative for activity change, appetite change, chills, diaphoresis, fatigue, fever and unexpected weight change.  HENT: Positive for congestion, postnasal drip, rhinorrhea, sinus pressure and sinus pain. Negative for dental problem, drooling, ear discharge, ear pain, facial swelling, hearing loss, mouth sores, nosebleeds, sneezing, sore throat, tinnitus, trouble swallowing and voice change.   Eyes: Negative for photophobia, pain, discharge, redness, itching and visual disturbance.  Respiratory: Positive for cough. Negative for apnea, choking, chest tightness, shortness of breath, wheezing and stridor.   Cardiovascular: Negative for chest pain, palpitations and leg swelling.  Gastrointestinal: Negative for abdominal pain, blood in stool,  constipation, diarrhea, nausea and vomiting.  Endocrine: Negative for cold intolerance, heat intolerance, polydipsia, polyphagia and polyuria.  Genitourinary: Negative for hematuria.  Skin: Negative for color change, pallor, rash and wound.  Allergic/Immunologic: Negative for environmental allergies, food allergies and immunocompromised state.  Neurological: Positive for headaches. Negative for dizziness, tremors, seizures, syncope, facial asymmetry, speech difficulty, weakness, light-headedness and numbness.  Hematological: Negative for adenopathy. Does not bruise/bleed easily.  Psychiatric/Behavioral: Negative for dysphoric mood, self-injury, sleep disturbance and suicidal ideas. The patient is not nervous/anxious.     Past Medical History:  Diagnosis Date  . ADD (attention deficit disorder)   . Depression   . PONV (postoperative nausea and vomiting)    Past Surgical History:  Procedure Laterality Date  . PARTIAL KNEE ARTHROPLASTY Right 01/05/2015   Procedure: RIGHT UNICOMPARTMENTAL KNEE (MEDIAL);  Surgeon: Mckinley Jewel, MD;  Location: [facility];  Service: Orthopedics;  Laterality: Right;  . RHINOPLASTY     Allergies  Allergen Reactions  . Adderall [Amphetamine-Dextroamphetamine]     Muscle pain   Current Outpatient Medications on File Prior to Visit  Medication Sig Dispense Refill  . amphetamine-dextroamphetamine (ADDERALL) 20 MG tablet Take 20 mg by mouth daily.    Marland Kitchen desvenlafaxine (PRISTIQ) 100 MG 24 hr tablet Take 100 mg by mouth daily.    Marland Kitchen dexmethylphenidate (FOCALIN XR) 20 MG 24 hr capsule Take 30 mg by mouth daily.    . ondansetron (ZOFRAN) 4 MG tablet Take 1 tablet (4 mg total) by mouth every 8 (eight) hours as needed for nausea or vomiting. 40 tablet 0  No current facility-administered medications on file prior to visit.    Social History   Socioeconomic History  . Marital status: Married    Spouse name: Not on file  . Number of children: Not on  file  . Years of education: Not on file  . Highest education level: Not on file  Occupational History  . Not on file  Social Needs  . Financial resource strain: Not on file  . Food insecurity:    Worry: Not on file    Inability: Not on file  . Transportation needs:    Medical: Not on file    Non-medical: Not on file  Tobacco Use  . Smoking status: Never Smoker  . Smokeless tobacco: Never Used  Substance and Sexual Activity  . Alcohol use: Not on file  . Drug use: Not on file  . Sexual activity: Not on file  Lifestyle  . Physical activity:    Days per week: Not on file    Minutes per session: Not on file  . Stress: Not on file  Relationships  . Social connections:    Talks on phone: Not on file    Gets together: Not on file    Attends religious service: Not on file    Active member of club or organization: Not on file    Attends meetings of clubs or organizations: Not on file    Relationship status: Not on file  . Intimate partner violence:    Fear of current or ex partner: Not on file    Emotionally abused: Not on file    Physically abused: Not on file    Forced sexual activity: Not on file  Other Topics Concern  . Not on file  Social History Narrative  . Not on file   Family History  Problem Relation Age of Onset  . Thyroid cancer Mother   . Hypertension Father   . Depression Father   . ADD / ADHD Brother        Objective:    BP 130/82   Pulse 95   Temp 98 F (36.7 C) (Oral)   Resp 16   Ht 6' 2.21" (1.885 m)   Wt 221 lb 9.6 oz (100.5 kg)   SpO2 97%   BMI 28.29 kg/m  Physical Exam  Constitutional: He is oriented to person, place, and time. He appears well-developed and well-nourished. No distress.  HENT:  Head: Normocephalic and atraumatic.  Right Ear: Tympanic membrane, external ear and ear canal normal.  Left Ear: Tympanic membrane, external ear and ear canal normal.  Nose: Mucosal edema and rhinorrhea present. Right sinus exhibits maxillary  sinus tenderness. Right sinus exhibits no frontal sinus tenderness. Left sinus exhibits maxillary sinus tenderness. Left sinus exhibits no frontal sinus tenderness.  Mouth/Throat: Uvula is midline, oropharynx is clear and moist and mucous membranes are normal.  Eyes: Conjunctivae and EOM are normal. Pupils are equal, round, and reactive to light.  Neck: Normal range of motion. Neck supple. Carotid bruit is not present. No thyromegaly present.  Cardiovascular: Normal rate, regular rhythm, normal heart sounds and intact distal pulses. Exam reveals no gallop and no friction rub.  No murmur heard. Pulmonary/Chest: Effort normal and breath sounds normal. He has no wheezes. He has no rales.  Lymphadenopathy:    He has no cervical adenopathy.  Neurological: He is alert and oriented to person, place, and time. No cranial nerve deficit.  Skin: Skin is warm and dry. No rash noted. He is not  diaphoretic.  Psychiatric: He has a normal mood and affect. His behavior is normal.  Nursing note and vitals reviewed.  No results found. Depression screen PHQ 2/9 09/05/2017  Decreased Interest 0  Down, Depressed, Hopeless 0  PHQ - 2 Score 0   Fall Risk  09/05/2017  Falls in the past year? No    Results for orders placed or performed in visit on 09/05/17  POCT Influenza A/B  Result Value Ref Range   Influenza A, POC Negative Negative   Influenza B, POC Negative Negative       Assessment & Plan:   1. Flu-like symptoms   2. Acute non-recurrent maxillary sinusitis    New onset upper respiratory infection.  Flu swab negative in office.  Treat with prednisone and Sudafed therapy.  If no improvement in 5 days, initiate Augmentin therapy for secondary sinusitis.  Return to clinic for acute worsening.  Orders Placed This Encounter  Procedures  . POCT Influenza A/B   Meds ordered this encounter  Medications  . predniSONE (DELTASONE) 20 MG tablet    Sig: Take 3 PO QAM x 1 day, 2 PO QAM x 5 days, 1 PO QAM  x 5 days    Dispense:  18 tablet    Refill:  0  . amoxicillin-clavulanate (AUGMENTIN) 875-125 MG tablet    Sig: Take 1 tablet by mouth 2 (two) times daily.    Dispense:  20 tablet    Refill:  0  . pseudoephedrine (SUDAFED) 120 MG 12 hr tablet    Sig: Take 1 tablet (120 mg total) by mouth 2 (two) times daily.    Dispense:  20 tablet    Refill:  0    No follow-ups on file.   Sheran Newstrom Paulita FujitaMartin Serayah Yazdani, M.D. [facility]  [place] [facility] [address] [place], [place]  [id number] [phone] phone [phone]) [phone] fax

## 2017-09-05 NOTE — Patient Instructions (Addendum)
   IF you received an x-ray today, you will receive an invoice from [facility]. Please contact  Radiology at [phone] with questions or concerns regarding your invoice.   IF you received labwork today, you will receive an invoice from [facility]. Please contact [facility] at [phone] with questions or concerns regarding your invoice.   Our billing staff will not be able to assist you with questions regarding bills from these companies.  You will be contacted with the lab results as soon as they are available. The fastest way to get your results is to activate your My Chart account. Instructions are located on the last page of this paperwork. If you have not heard from us regarding the results in 2 weeks, please contact this office.      Sinusitis, Adult Sinusitis is soreness and inflammation of your sinuses. Sinuses are hollow spaces in the bones around your face. Your sinuses are located:  Around your eyes.  In the middle of your forehead.  Behind your nose.  In your cheekbones.  Your sinuses and nasal passages are lined with a stringy fluid (mucus). Mucus normally drains out of your sinuses. When your nasal tissues become inflamed or swollen, the mucus can become trapped or blocked so air cannot flow through your sinuses. This allows bacteria, viruses, and funguses to grow, which leads to infection. Sinusitis can develop quickly and last for 7?10 days (acute) or for more than 12 weeks (chronic). Sinusitis often develops after a cold. What are the causes? This condition is caused by anything that creates swelling in the sinuses or stops mucus from draining, including:  Allergies.  Asthma.  Bacterial or viral infection.  Abnormally shaped bones between the nasal passages.  Nasal growths that contain mucus (nasal polyps).  Narrow sinus openings.  Pollutants, such as chemicals or irritants in the air.  A foreign object stuck in the nose.  A fungal  infection. This is rare.  What increases the risk? The following factors may make you more likely to develop this condition:  Having allergies or asthma.  Having had a recent cold or respiratory tract infection.  Having structural deformities or blockages in your nose or sinuses.  Having a weak immune system.  Doing a lot of swimming or diving.  Overusing nasal sprays.  Smoking.  What are the signs or symptoms? The main symptoms of this condition are pain and a feeling of pressure around the affected sinuses. Other symptoms include:  Upper toothache.  Earache.  Headache.  Bad breath.  Decreased sense of smell and taste.  A cough that may get worse at night.  Fatigue.  Fever.  Thick drainage from your nose. The drainage is often green and it may contain pus (purulent).  Stuffy nose or congestion.  Postnasal drip. This is when extra mucus collects in the throat or back of the nose.  Swelling and warmth over the affected sinuses.  Sore throat.  Sensitivity to light.  How is this diagnosed? This condition is diagnosed based on symptoms, a medical history, and a physical exam. To find out if your condition is acute or chronic, your health care provider may:  Look in your nose for signs of nasal polyps.  Tap over the affected sinus to check for signs of infection.  View the inside of your sinuses using an imaging device that has a light attached (endoscope).  If your health care provider suspects that you have chronic sinusitis, you may also:  Be tested for allergies.    Have a sample of mucus taken from your nose (nasal culture) and checked for bacteria.  Have a mucus sample examined to see if your sinusitis is related to an allergy.  If your sinusitis does not respond to treatment and it lasts longer than 8 weeks, you may have an MRI or CT scan to check your sinuses. These scans also help to determine how severe your infection is. In rare cases, a bone  biopsy may be done to rule out more serious types of fungal sinus disease. How is this treated? Treatment for sinusitis depends on the cause and whether your condition is chronic or acute. If a virus is causing your sinusitis, your symptoms will go away on their own within 10 days. You may be given medicines to relieve your symptoms, including:  Topical nasal decongestants. They shrink swollen nasal passages and let mucus drain from your sinuses.  Antihistamines. These drugs block inflammation that is triggered by allergies. This can help to ease swelling in your nose and sinuses.  Topical nasal corticosteroids. These are nasal sprays that ease inflammation and swelling in your nose and sinuses.  Nasal saline washes. These rinses can help to get rid of thick mucus in your nose.  If your condition is caused by bacteria, you will be given an antibiotic medicine. If your condition is caused by a fungus, you will be given an antifungal medicine. Surgery may be needed to correct underlying conditions, such as narrow nasal passages. Surgery may also be needed to remove polyps. Follow these instructions at home: Medicines  Take, use, or apply over-the-counter and prescription medicines only as told by your health care provider. These may include nasal sprays.  If you were prescribed an antibiotic medicine, take it as told by your health care provider. Do not stop taking the antibiotic even if you start to feel better. Hydrate and Humidify  Drink enough water to keep your urine clear or pale yellow. Staying hydrated will help to thin your mucus.  Use a cool mist humidifier to keep the humidity level in your home above 50%.  Inhale steam for 10-15 minutes, 3-4 times a day or as told by your health care provider. You can do this in the bathroom while a hot shower is running.  Limit your exposure to cool or dry air. Rest  Rest as much as possible.  Sleep with your head raised  (elevated).  Make sure to get enough sleep each night. General instructions  Apply a warm, moist washcloth to your face 3-4 times a day or as told by your health care provider. This will help with discomfort.  Wash your hands often with soap and water to reduce your exposure to viruses and other germs. If soap and water are not available, use hand sanitizer.  Do not smoke. Avoid being around people who are smoking (secondhand smoke).  Keep all follow-up visits as told by your health care provider. This is important. Contact a health care provider if:  You have a fever.  Your symptoms get worse.  Your symptoms do not improve within 10 days. Get help right away if:  You have a severe headache.  You have persistent vomiting.  You have pain or swelling around your face or eyes.  You have vision problems.  You develop confusion.  Your neck is stiff.  You have trouble breathing. This information is not intended to replace advice given to you by your health care provider. Make sure you discuss any questions you   have with your health care provider. Document Released: 06/10/2005 Document Revised: 02/04/2016 Document Reviewed: 04/05/2015 Elsevier Interactive Patient Education  2018 [employer]  

## 2017-09-12 DIAGNOSIS — Z1211 Encounter for screening for malignant neoplasm of colon: Secondary | ICD-10-CM | POA: Diagnosis not present

## 2017-09-12 DIAGNOSIS — K589 Irritable bowel syndrome without diarrhea: Secondary | ICD-10-CM | POA: Diagnosis not present

## 2017-09-12 DIAGNOSIS — D126 Benign neoplasm of colon, unspecified: Secondary | ICD-10-CM | POA: Diagnosis not present

## 2017-09-16 DIAGNOSIS — Z1211 Encounter for screening for malignant neoplasm of colon: Secondary | ICD-10-CM | POA: Diagnosis not present

## 2017-09-16 DIAGNOSIS — D126 Benign neoplasm of colon, unspecified: Secondary | ICD-10-CM | POA: Diagnosis not present

## 2017-10-14 DIAGNOSIS — H01024 Squamous blepharitis left upper eyelid: Secondary | ICD-10-CM | POA: Diagnosis not present

## 2017-10-14 DIAGNOSIS — H04123 Dry eye syndrome of bilateral lacrimal glands: Secondary | ICD-10-CM | POA: Diagnosis not present

## 2017-10-14 DIAGNOSIS — H01021 Squamous blepharitis right upper eyelid: Secondary | ICD-10-CM | POA: Diagnosis not present

## 2017-10-14 DIAGNOSIS — H01022 Squamous blepharitis right lower eyelid: Secondary | ICD-10-CM | POA: Diagnosis not present

## 2017-11-06 DIAGNOSIS — J329 Chronic sinusitis, unspecified: Secondary | ICD-10-CM | POA: Diagnosis not present

## 2017-11-13 ENCOUNTER — Encounter: Payer: Self-pay | Admitting: Family Medicine

## 2017-12-10 DIAGNOSIS — M545 Low back pain: Secondary | ICD-10-CM | POA: Diagnosis not present

## 2017-12-12 DIAGNOSIS — M545 Low back pain: Secondary | ICD-10-CM | POA: Diagnosis not present

## 2018-01-23 DIAGNOSIS — M545 Low back pain: Secondary | ICD-10-CM | POA: Diagnosis not present

## 2018-01-26 DIAGNOSIS — M545 Low back pain: Secondary | ICD-10-CM | POA: Diagnosis not present

## 2018-01-30 DIAGNOSIS — M545 Low back pain: Secondary | ICD-10-CM | POA: Diagnosis not present

## 2018-02-27 DIAGNOSIS — E559 Vitamin D deficiency, unspecified: Secondary | ICD-10-CM | POA: Diagnosis not present

## 2018-02-27 DIAGNOSIS — F902 Attention-deficit hyperactivity disorder, combined type: Secondary | ICD-10-CM | POA: Diagnosis not present

## 2018-07-01 DIAGNOSIS — L821 Other seborrheic keratosis: Secondary | ICD-10-CM | POA: Diagnosis not present

## 2018-07-01 DIAGNOSIS — D225 Melanocytic nevi of trunk: Secondary | ICD-10-CM | POA: Diagnosis not present

## 2018-07-01 DIAGNOSIS — L814 Other melanin hyperpigmentation: Secondary | ICD-10-CM | POA: Diagnosis not present

## 2018-07-01 DIAGNOSIS — Z86018 Personal history of other benign neoplasm: Secondary | ICD-10-CM | POA: Diagnosis not present

## 2018-12-11 DIAGNOSIS — F902 Attention-deficit hyperactivity disorder, combined type: Secondary | ICD-10-CM | POA: Diagnosis not present

## 2018-12-11 DIAGNOSIS — E7800 Pure hypercholesterolemia, unspecified: Secondary | ICD-10-CM | POA: Diagnosis not present

## 2018-12-11 DIAGNOSIS — F329 Major depressive disorder, single episode, unspecified: Secondary | ICD-10-CM | POA: Diagnosis not present

## 2018-12-11 DIAGNOSIS — Z0000 Encounter for general adult medical examination without abnormal findings: Secondary | ICD-10-CM | POA: Diagnosis not present

## 2018-12-11 DIAGNOSIS — E559 Vitamin D deficiency, unspecified: Secondary | ICD-10-CM | POA: Diagnosis not present

## 2018-12-16 DIAGNOSIS — M542 Cervicalgia: Secondary | ICD-10-CM | POA: Diagnosis not present

## 2018-12-16 DIAGNOSIS — M25512 Pain in left shoulder: Secondary | ICD-10-CM | POA: Diagnosis not present

## 2018-12-23 DIAGNOSIS — Z125 Encounter for screening for malignant neoplasm of prostate: Secondary | ICD-10-CM | POA: Diagnosis not present

## 2018-12-23 DIAGNOSIS — Z0000 Encounter for general adult medical examination without abnormal findings: Secondary | ICD-10-CM | POA: Diagnosis not present

## 2018-12-23 DIAGNOSIS — E559 Vitamin D deficiency, unspecified: Secondary | ICD-10-CM | POA: Diagnosis not present

## 2018-12-23 DIAGNOSIS — E7800 Pure hypercholesterolemia, unspecified: Secondary | ICD-10-CM | POA: Diagnosis not present

## 2018-12-28 DIAGNOSIS — M542 Cervicalgia: Secondary | ICD-10-CM | POA: Diagnosis not present

## 2018-12-28 DIAGNOSIS — M25512 Pain in left shoulder: Secondary | ICD-10-CM | POA: Diagnosis not present

## 2019-03-04 DIAGNOSIS — M545 Low back pain: Secondary | ICD-10-CM | POA: Diagnosis not present

## 2019-04-06 DIAGNOSIS — E559 Vitamin D deficiency, unspecified: Secondary | ICD-10-CM | POA: Diagnosis not present

## 2019-06-07 ENCOUNTER — Other Ambulatory Visit: Payer: Self-pay | Admitting: Family Medicine

## 2019-06-07 ENCOUNTER — Ambulatory Visit
Admission: RE | Admit: 2019-06-07 | Discharge: 2019-06-07 | Disposition: A | Discharge status: Home / Self Care | Payer: BLUE CROSS/BLUE SHIELD | Source: Ambulatory Visit | Attending: Family Medicine | Admitting: Family Medicine

## 2019-06-07 ENCOUNTER — Other Ambulatory Visit: Payer: Self-pay

## 2019-06-07 DIAGNOSIS — R0789 Other chest pain: Secondary | ICD-10-CM | POA: Diagnosis not present

## 2019-06-07 DIAGNOSIS — F902 Attention-deficit hyperactivity disorder, combined type: Secondary | ICD-10-CM | POA: Diagnosis not present

## 2019-06-07 DIAGNOSIS — F329 Major depressive disorder, single episode, unspecified: Secondary | ICD-10-CM | POA: Diagnosis not present

## 2019-06-07 DIAGNOSIS — S2231XA Fracture of one rib, right side, initial encounter for closed fracture: Secondary | ICD-10-CM | POA: Diagnosis not present

## 2019-06-07 DIAGNOSIS — E559 Vitamin D deficiency, unspecified: Secondary | ICD-10-CM | POA: Diagnosis not present

## 2019-06-21 DIAGNOSIS — L814 Other melanin hyperpigmentation: Secondary | ICD-10-CM | POA: Diagnosis not present

## 2019-06-21 DIAGNOSIS — D225 Melanocytic nevi of trunk: Secondary | ICD-10-CM | POA: Diagnosis not present

## 2019-06-21 DIAGNOSIS — Z86018 Personal history of other benign neoplasm: Secondary | ICD-10-CM | POA: Diagnosis not present

## 2019-06-21 DIAGNOSIS — L821 Other seborrheic keratosis: Secondary | ICD-10-CM | POA: Diagnosis not present

## 2019-07-14 DIAGNOSIS — M25561 Pain in right knee: Secondary | ICD-10-CM | POA: Diagnosis not present

## 2019-07-14 DIAGNOSIS — M25562 Pain in left knee: Secondary | ICD-10-CM | POA: Diagnosis not present

## 2019-08-11 DIAGNOSIS — Z20828 Contact with and (suspected) exposure to other viral communicable diseases: Secondary | ICD-10-CM | POA: Diagnosis not present

## 2019-12-21 DIAGNOSIS — F902 Attention-deficit hyperactivity disorder, combined type: Secondary | ICD-10-CM | POA: Diagnosis not present

## 2019-12-21 DIAGNOSIS — E559 Vitamin D deficiency, unspecified: Secondary | ICD-10-CM | POA: Diagnosis not present

## 2019-12-21 DIAGNOSIS — F329 Major depressive disorder, single episode, unspecified: Secondary | ICD-10-CM | POA: Diagnosis not present

## 2020-01-03 DIAGNOSIS — H524 Presbyopia: Secondary | ICD-10-CM | POA: Diagnosis not present

## 2020-01-03 DIAGNOSIS — H0102A Squamous blepharitis right eye, upper and lower eyelids: Secondary | ICD-10-CM | POA: Diagnosis not present

## 2020-01-03 DIAGNOSIS — H5213 Myopia, bilateral: Secondary | ICD-10-CM | POA: Diagnosis not present

## 2020-01-03 DIAGNOSIS — H2513 Age-related nuclear cataract, bilateral: Secondary | ICD-10-CM | POA: Diagnosis not present

## 2020-01-03 DIAGNOSIS — H0102B Squamous blepharitis left eye, upper and lower eyelids: Secondary | ICD-10-CM | POA: Diagnosis not present

## 2020-01-03 DIAGNOSIS — H52203 Unspecified astigmatism, bilateral: Secondary | ICD-10-CM | POA: Diagnosis not present

## 2020-01-03 DIAGNOSIS — H04123 Dry eye syndrome of bilateral lacrimal glands: Secondary | ICD-10-CM | POA: Diagnosis not present

## 2020-01-12 DIAGNOSIS — Z20822 Contact with and (suspected) exposure to covid-19: Secondary | ICD-10-CM | POA: Diagnosis not present

## 2020-03-14 DIAGNOSIS — M25512 Pain in left shoulder: Secondary | ICD-10-CM | POA: Diagnosis not present

## 2020-03-14 DIAGNOSIS — M542 Cervicalgia: Secondary | ICD-10-CM | POA: Diagnosis not present

## 2020-04-05 DIAGNOSIS — M7712 Lateral epicondylitis, left elbow: Secondary | ICD-10-CM | POA: Diagnosis not present

## 2020-04-05 DIAGNOSIS — M25522 Pain in left elbow: Secondary | ICD-10-CM | POA: Diagnosis not present

## 2020-04-05 DIAGNOSIS — M542 Cervicalgia: Secondary | ICD-10-CM | POA: Diagnosis not present

## 2020-05-26 DIAGNOSIS — Z125 Encounter for screening for malignant neoplasm of prostate: Secondary | ICD-10-CM | POA: Diagnosis not present

## 2020-05-26 DIAGNOSIS — F902 Attention-deficit hyperactivity disorder, combined type: Secondary | ICD-10-CM | POA: Diagnosis not present

## 2020-05-26 DIAGNOSIS — F329 Major depressive disorder, single episode, unspecified: Secondary | ICD-10-CM | POA: Diagnosis not present

## 2020-05-26 DIAGNOSIS — E559 Vitamin D deficiency, unspecified: Secondary | ICD-10-CM | POA: Diagnosis not present

## 2020-05-26 DIAGNOSIS — E7800 Pure hypercholesterolemia, unspecified: Secondary | ICD-10-CM | POA: Diagnosis not present

## 2020-05-29 DIAGNOSIS — E7800 Pure hypercholesterolemia, unspecified: Secondary | ICD-10-CM | POA: Diagnosis not present

## 2020-06-20 DIAGNOSIS — L821 Other seborrheic keratosis: Secondary | ICD-10-CM | POA: Diagnosis not present

## 2020-06-20 DIAGNOSIS — Z86018 Personal history of other benign neoplasm: Secondary | ICD-10-CM | POA: Diagnosis not present

## 2020-06-20 DIAGNOSIS — L814 Other melanin hyperpigmentation: Secondary | ICD-10-CM | POA: Diagnosis not present

## 2020-06-20 DIAGNOSIS — D225 Melanocytic nevi of trunk: Secondary | ICD-10-CM | POA: Diagnosis not present
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
## 2023-09-19 ENCOUNTER — Other Ambulatory Visit: Payer: Self-pay | Admitting: Family Medicine

## 2023-09-19 DIAGNOSIS — I2510 Atherosclerotic heart disease of native coronary artery without angina pectoris: Secondary | ICD-10-CM

## 2023-09-19 NOTE — Progress Notes (Signed)
 Elevated ASCVD risk given FMHx and HLD.  CAC ordered.

## 2023-10-15 ENCOUNTER — Ambulatory Visit (HOSPITAL_BASED_OUTPATIENT_CLINIC_OR_DEPARTMENT_OTHER)
Admission: RE | Admit: 2023-10-15 | Discharge: 2023-10-15 | Disposition: A | Discharge status: Home / Self Care | Payer: Self-pay | Source: Ambulatory Visit | Attending: Family Medicine | Admitting: Family Medicine

## 2023-10-15 DIAGNOSIS — I2510 Atherosclerotic heart disease of native coronary artery without angina pectoris: Secondary | ICD-10-CM | POA: Insufficient documentation

## 2024-01-07 ENCOUNTER — Ambulatory Visit
Admission: EM | Admit: 2024-01-07 | Discharge: 2024-01-07 | Disposition: A | Discharge status: Home / Self Care | Attending: Nurse Practitioner | Admitting: Nurse Practitioner

## 2024-01-07 ENCOUNTER — Other Ambulatory Visit: Payer: Self-pay

## 2024-01-07 DIAGNOSIS — J029 Acute pharyngitis, unspecified: Secondary | ICD-10-CM | POA: Insufficient documentation

## 2024-01-07 DIAGNOSIS — Z20818 Contact with and (suspected) exposure to other bacterial communicable diseases: Secondary | ICD-10-CM | POA: Diagnosis not present

## 2024-01-07 LAB — POC SARS CORONAVIRUS 2 AG -  ED: SARS Coronavirus 2 Ag: NEGATIVE

## 2024-01-07 LAB — POCT RAPID STREP A (OFFICE): Rapid Strep A Screen: NEGATIVE

## 2024-01-07 MED ORDER — AMOXICILLIN 500 MG PO CAPS: 500.0000 mg | ORAL_CAPSULE | Freq: Two times a day (BID) | ORAL | 0 refills | Status: AC

## 2024-01-07 NOTE — ED Provider Notes (Signed)
 [facility]    CSN: [id number] Arrival date & time: 01/07/24  9046      History   Chief Complaint Chief Complaint  Patient presents with   Sore Throat   Fatigue   Headache    HPI Ruben Mcdonald is a 58 y.o. male.   Discussed the use of AI scribe software for clinical note transcription with the patient, who gave verbal consent to proceed.   Patient presents with recurrent symptoms of strep throat. He initially experienced symptoms before July 4th weekend and was diagnosed with strep throat at his office's in-house clinic on Monday, July 8th, 2025. The patient reports that he was treated with a Z-Pak for strep throat. He felt better initially, but his symptoms returned on Sunday, July 14th. His current symptoms include the sensation of fatigue, and generally feeling unwell. He describes the throat discomfort as more of a swollen sensation rather than pain, rating it as an 8 out of 10 in severity. Associated symptoms include a little bit of nasal congestion and cough. The patient denies any current fever, chills, or body aches, though he reports having a low-grade fever last week. He also denies runny nose, sneezing, wheezing, shortness of breath, nausea, vomiting, or diarrhea.  Patient also reports that his son, who lives with him, was diagnosed with strep throat yesterday (July 15th). His son had been at the beach from Saturday to Tuesday and started antibiotics yesterday. The patient expresses concern that he may have been re-exposed to strep through his son, despite not having been around him during the weekend.  The patient reports taking Nyquil last night for symptom relief but has not taken any other medications besides the previously prescribed Z-Pak. He denies smoking or vaping.   The following portions of the patient's history were reviewed and updated as appropriate: allergies, current medications, past family history, past medical history, past social history, past  surgical history, and problem list.    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 Authorizing Provider  amoxicillin  (AMOXIL ) 500 MG capsule Take 1 capsule (500 mg total) by mouth 2 (two) times daily for 10 days. 01/07/24 01/17/24 Yes Iola Lukes, FNP  cholecalciferol (VITAMIN D3) 25 MCG (1000 UNIT) tablet Take 1,000 Units by mouth daily.   Yes [provider]  desvenlafaxine (PRISTIQ) 50 MG 24 hr tablet Take 50 mg by mouth daily.   Yes [provider]  desvenlafaxine (PRISTIQ) 100 MG 24 hr tablet Take 100 mg by mouth daily.    [provider]  dexmethylphenidate (FOCALIN XR) 20 MG 24 hr capsule Take 30 mg by mouth daily.    [provider]    Family History Family History  Problem Relation Age of Onset   Thyroid cancer Mother    Hypertension Father    Depression Father    ADD / ADHD Brother  Social History Social History   Tobacco Use   Smoking status: Never   Smokeless tobacco: Never  Substance Use Topics   Alcohol use: Yes    Alcohol/week: 3.0 standard drinks of alcohol    Types: 3 Cans of beer per week   Drug use: Never     Allergies   Adderall [amphetamine-dextroamphetamine]   Review of Systems Review of Systems  Constitutional:  Positive for fatigue and fever (mild fever last week but none currently).  HENT:  Positive for congestion  (mild). Negative for rhinorrhea, sneezing and sore throat (no pain just discomfort with sensation of swelling of the right side of neck).   Respiratory:  Positive for cough (mild). Negative for shortness of breath and wheezing.   Gastrointestinal:  Negative for diarrhea, nausea and vomiting.  Musculoskeletal:  Negative for myalgias.  Neurological:  Positive for headaches.  All other systems reviewed and are negative.    Physical Exam Triage Vital Signs ED Triage Vitals  Encounter Vitals Group     BP 01/07/24 1032 130/83     Girls Systolic BP Percentile --      Girls Diastolic BP Percentile --      Boys Systolic BP Percentile --      Boys Diastolic BP Percentile --      Pulse Rate 01/07/24 1032 73     Resp 01/07/24 1032 18     Temp 01/07/24 1032 98.1 F (36.7 C)     Temp Source 01/07/24 1032 Oral     SpO2 01/07/24 1032 94 %     Weight --      Height --      Head Circumference --      Peak Flow --      Pain Score 01/07/24 1027 3     Pain Loc --      Pain Education --      Exclude from Growth Chart --    No data found.  Updated Vital Signs BP 130/83   Pulse 73   Temp 98.1 F (36.7 C) (Oral)   Resp 18   SpO2 94%   Visual Acuity Right Eye Distance:   Left Eye Distance:   Bilateral Distance:    Right Eye Near:   Left Eye Near:    Bilateral Near:     Physical Exam Vitals reviewed.  Constitutional:      General: He is awake. He is not in acute distress.    Appearance: Normal appearance. He is well-developed. He is not ill-appearing, toxic-appearing or diaphoretic.  HENT:     Head: Normocephalic.     Right Ear: Tympanic membrane, ear canal and external ear normal. No drainage, swelling or tenderness. No middle ear effusion. Tympanic membrane is not erythematous.     Left Ear: Tympanic membrane, ear canal and external ear normal. No drainage, swelling or tenderness.  No middle ear effusion. Tympanic membrane is not erythematous.     Nose: Congestion present. No  rhinorrhea.     Mouth/Throat:     Lips: Pink.     Mouth: Mucous membranes are moist.     Pharynx: Oropharynx is clear. Uvula midline. No pharyngeal swelling, oropharyngeal exudate, posterior oropharyngeal erythema or uvula swelling.     Tonsils: No tonsillar exudate or tonsillar abscesses.  Eyes:     General: Vision grossly intact.     Conjunctiva/sclera: Conjunctivae normal.  Cardiovascular:     Rate and Rhythm: Normal rate.     Heart sounds: Normal heart sounds.  Pulmonary:     Effort: Pulmonary effort is normal. No tachypnea or respiratory distress.     Breath sounds: Normal breath sounds and air entry.  Musculoskeletal:        General: Normal range of motion.     Cervical back: Normal range of motion and neck supple.  Lymphadenopathy:     Cervical: No cervical adenopathy.  Skin:    General: Skin is warm and dry.  Neurological:     General: No focal deficit present.     Mental Status: He is alert and oriented to person, place, and time.  Psychiatric:        Behavior: Behavior is cooperative.      UC Treatments / Results  Labs (all labs ordered are listed, but only abnormal results are displayed) Labs Reviewed  CULTURE, GROUP A STREP [facility])  POCT RAPID STREP A (OFFICE)  POC SARS CORONAVIRUS 2 AG -  ED    EKG   Radiology No results found.  Procedures Procedures (including critical care time)  Medications Ordered in UC Medications - No data to display  Initial Impression / Assessment and Plan / UC Course  I have reviewed the triage vital signs and the nursing notes.  Pertinent labs & imaging results that were available during my care of the patient were reviewed by me and considered in my medical decision making (see chart for details).     Patient presents with recurrent symptoms suggestive of streptococcal pharyngitis following a recent Z-pak completed one week ago. He initially tested positive for strep prior to the July 4th weekend and now reports  renewed symptoms including throat swelling sensation, headache, fatigue, and malaise. He also reports recent close contact with his son, who tested positive for strep yesterday. Rapid strep test in clinic today is negative, but throat culture has been obtained and is pending. Physical exam is unremarkable with no visible erythema, exudate, or lymphadenopathy. Despite the negative rapid test, clinical suspicion remains given recent exposure and symptom recurrence. Amoxicillin  500 mg twice daily for 10 days was prescribed, and the patient was instructed to complete the full antibiotic course and use supportive care such as hydration, throat lozenges, and rest. He was advised to sanitize or replace his toothbrush 3-4 days into treatment to reduce reinfection risk. A COVID-19 test was also performed in office. Patient should follow up with PCP if symptoms persist beyond the antibiotic course or worsen, and seek ED care if he develops difficulty breathing, inability to swallow, or high fever.  Today's evaluation has revealed no signs of a dangerous process. Discussed diagnosis with patient and/or guardian. Patient and/or guardian aware of their diagnosis, possible red flag symptoms to watch out for and need for close follow up. Patient and/or guardian understands verbal and written discharge instructions. Patient and/or guardian comfortable with plan and disposition.  Patient and/or guardian has a clear mental status at this time, good insight into illness (after discussion and teaching) and has clear judgment to make decisions regarding their care  Documentation was completed with the aid of voice recognition software. Transcription may contain typographical errors. Final Clinical Impressions(s) / UC Diagnoses   Final diagnoses:  Acute sore throat  Exposure to strep throat     Discharge Instructions      You have been treated for suspected recurrent streptococcal pharyngitis. Although your rapid strep  test today was negative, a throat culture was collected and is currently pending. Due to your symptoms and recent exposure to a confirmed case,  you were prescribed amoxicillin  500 mg to take by mouth twice daily for 10 days. Be sure to complete the full course of antibiotics even if your symptoms begin to improve. To help reduce the risk of reinfection, replace or sanitize your toothbrush 3 to 4 days after starting antibiotics. You may manage symptoms at home with throat lozenges, warm salt water gargles, acetaminophen  or ibuprofen for pain or fever, and plenty of fluids and rest. If your throat pain becomes severe, you have difficulty swallowing, develop a high fever, or experience shortness of breath or swelling of the face or neck, seek immediate medical attention. Follow up with your primary care provider if your symptoms do not improve or if you have any questions once your culture results are available.      ED Prescriptions     Medication Sig Dispense Auth. Provider   amoxicillin  (AMOXIL ) 500 MG capsule Take 1 capsule (500 mg total) by mouth 2 (two) times daily for 10 days. 20 capsule Iola Lukes, FNP      PDMP not reviewed this encounter.   Iola Lukes, [place] 01/07/24 1124

## 2024-01-07 NOTE — ED Triage Notes (Signed)
 Pt c/o swollen throat, fatigue, [id number].

## 2024-01-07 NOTE — Discharge Instructions (Addendum)
 You have been treated for suspected recurrent streptococcal pharyngitis. Although your rapid strep test today was negative, a throat culture was collected and is currently pending. Due to your symptoms and recent exposure to a confirmed case, you were prescribed amoxicillin  500 mg to take by mouth twice daily for 10 days. Be sure to complete the full course of antibiotics even if your symptoms begin to improve. To help reduce the risk of reinfection, replace or sanitize your toothbrush 3 to 4 days after starting antibiotics. You may manage symptoms at home with throat lozenges, warm salt water gargles, acetaminophen  or ibuprofen for pain or fever, and plenty of fluids and rest. If your throat pain becomes severe, you have difficulty swallowing, develop a high fever, or experience shortness of breath or swelling of the face or neck, seek immediate medical attention. Follow up with your primary care provider if your symptoms do not improve or if you have any questions once your culture results are available.

## 2024-01-10 LAB — CULTURE, GROUP A STREP (THRC)

## 2024-01-12 ENCOUNTER — Ambulatory Visit (HOSPITAL_COMMUNITY): Payer: Self-pay
